# Patient Record
Sex: Female | Born: 2000
Health system: Southern US, Community
[De-identification: ages and names within clinical notes are randomized; demographics above are authoritative.]

---
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
## 2019-02-05 DIAGNOSIS — Z20828 Contact with and (suspected) exposure to other viral communicable diseases: Secondary | ICD-10-CM | POA: Diagnosis not present

## 2019-02-06 IMAGING — US US TRANSVAGINAL NON-OB
1 series · 14 of 25 positions shown · non-contrast
Comparison: None.

CLINICAL DATA: IUD location

EXAM:
ULTRASOUND PELVIS TRANSVAGINAL
TECHNIQUE: Transvaginal ultrasound examination of the pelvis was performed
including evaluation of the uterus, ovaries, adnexal regions, and
pelvic cul-de-sac.

[Series 1: us transvaginal non-ob · 0.08mm/px · 14 of 96 slices shown]
[im 1/96]
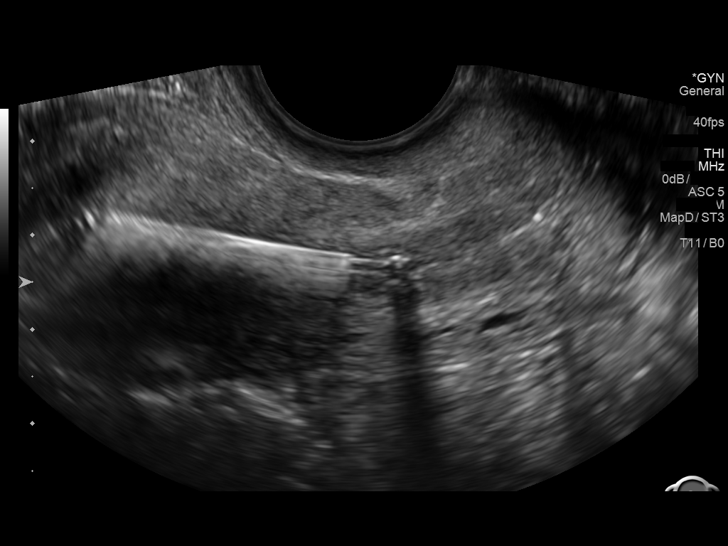
[im 8/96]
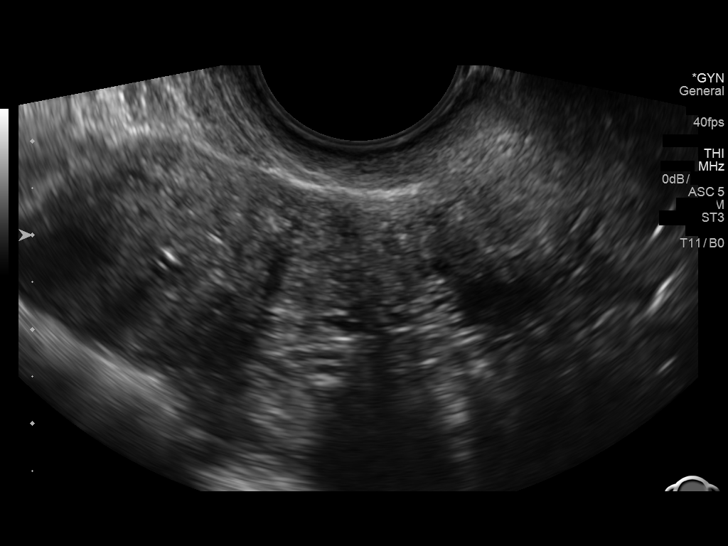
[im 16/96]
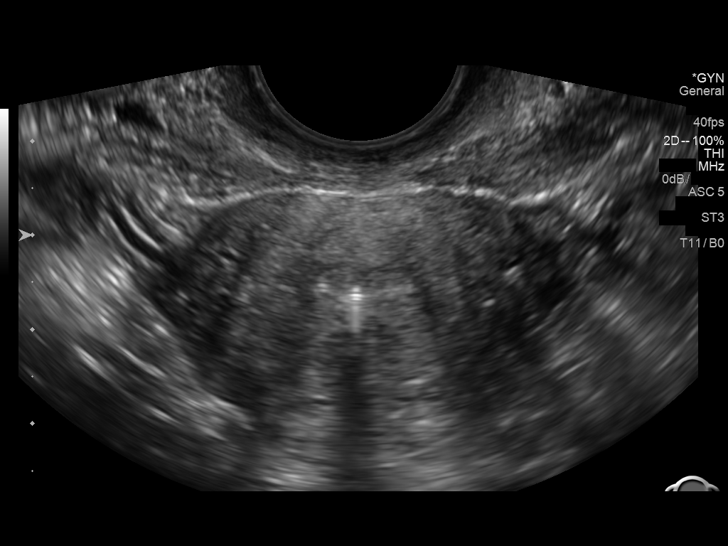
[im 24/96]
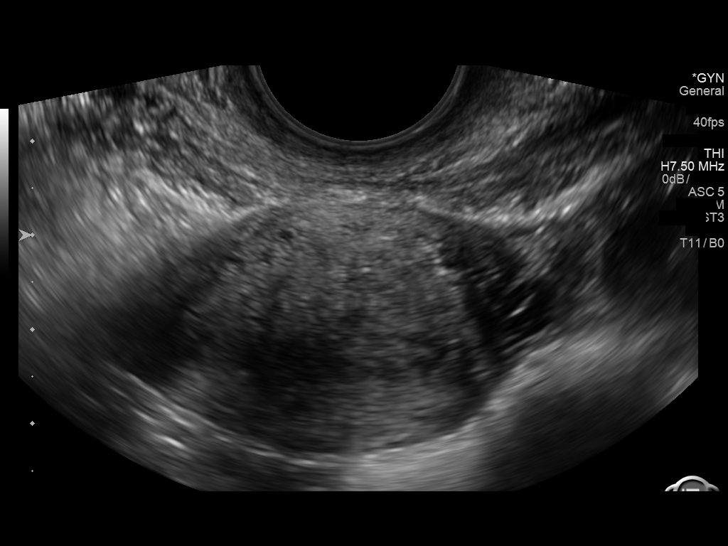
[im 32/96]
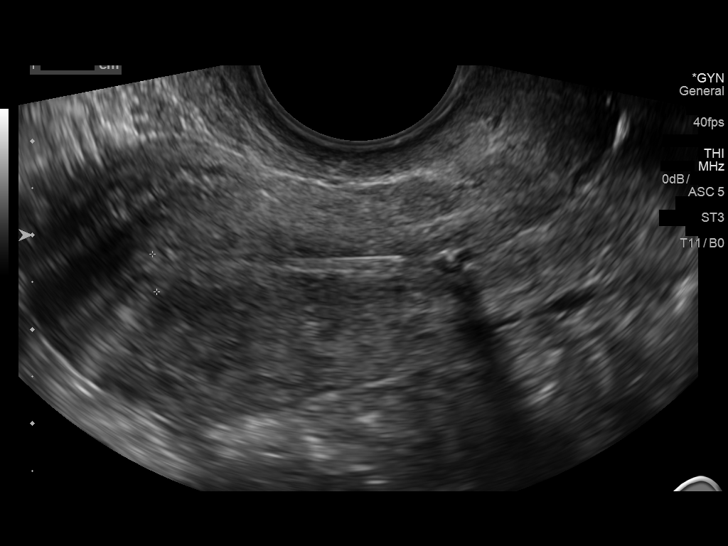
[im 36/96]
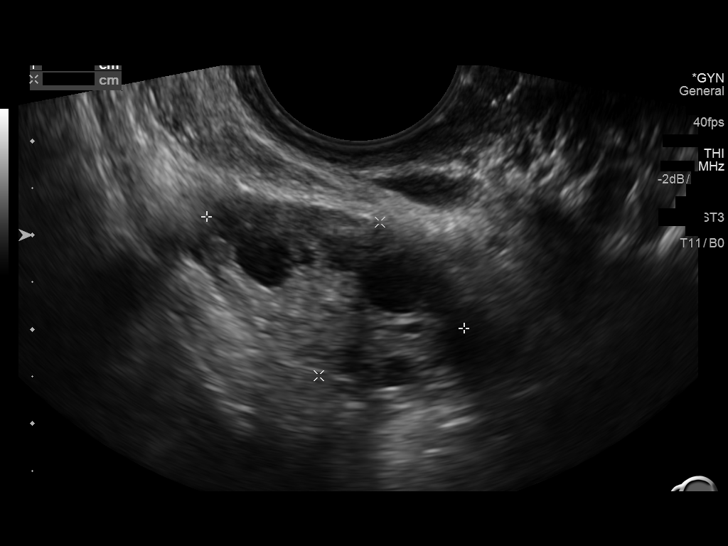
[im 44/96]
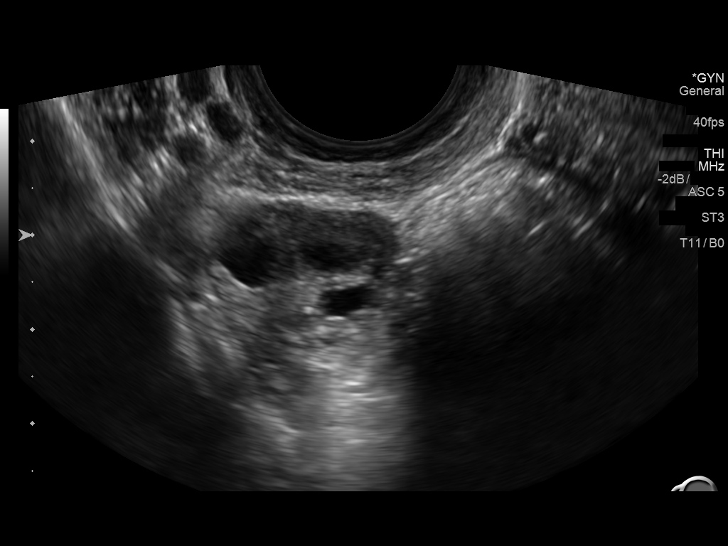
[im 52/96]
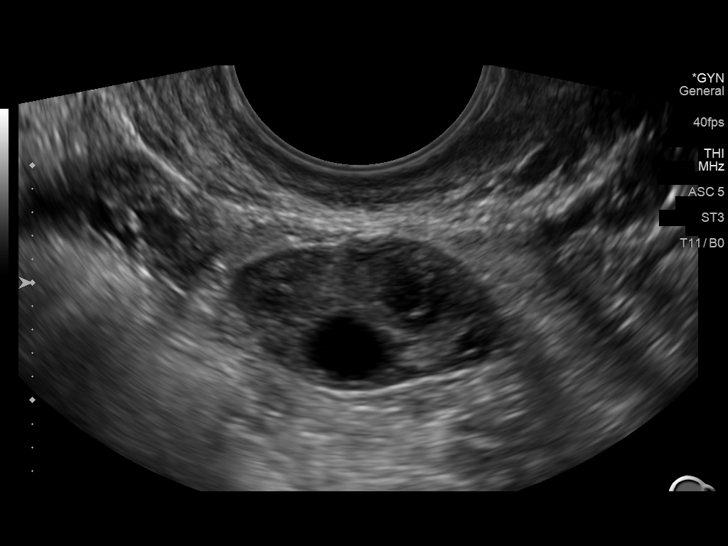
[im 60/96]
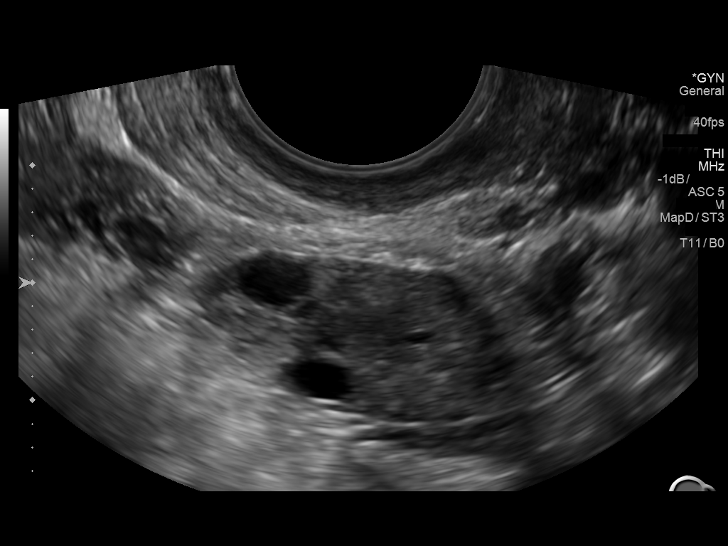
[im 64/96]
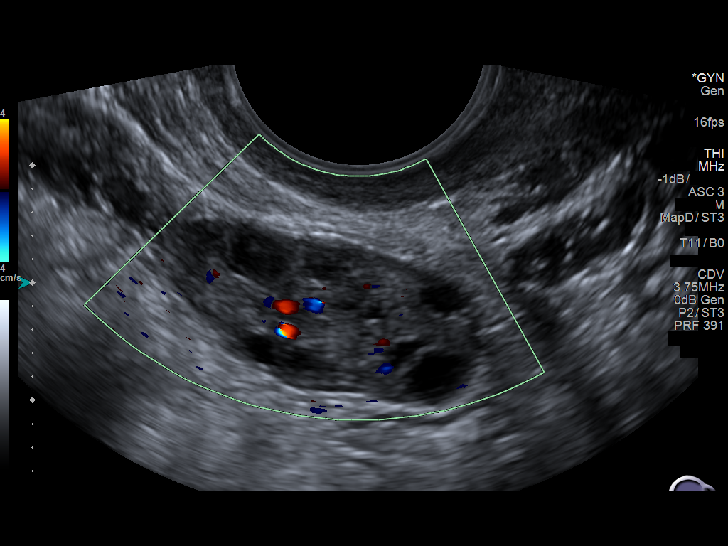
[im 72/96]
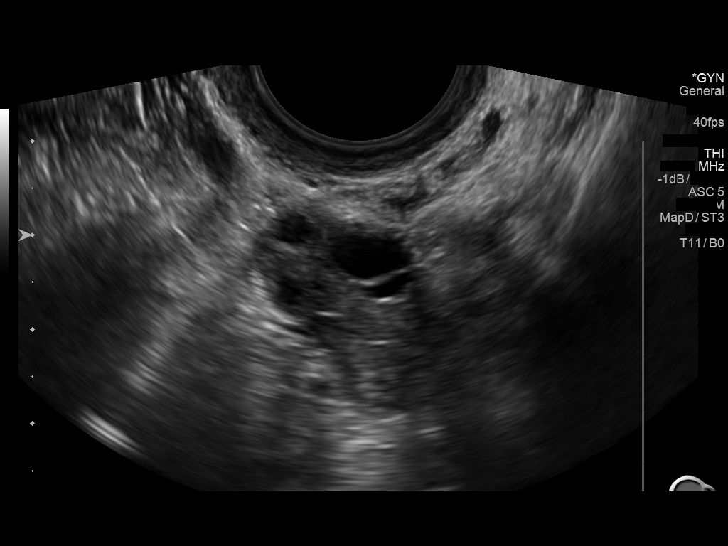
[im 80/96]
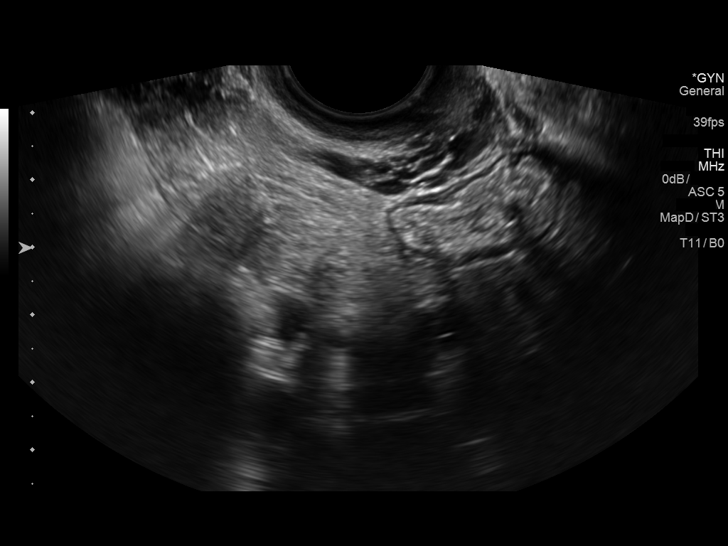
[im 88/96]
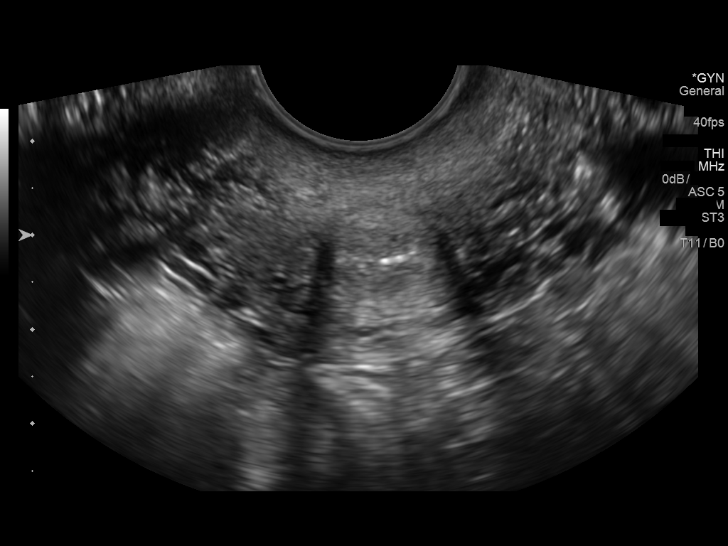
[im 96/96]
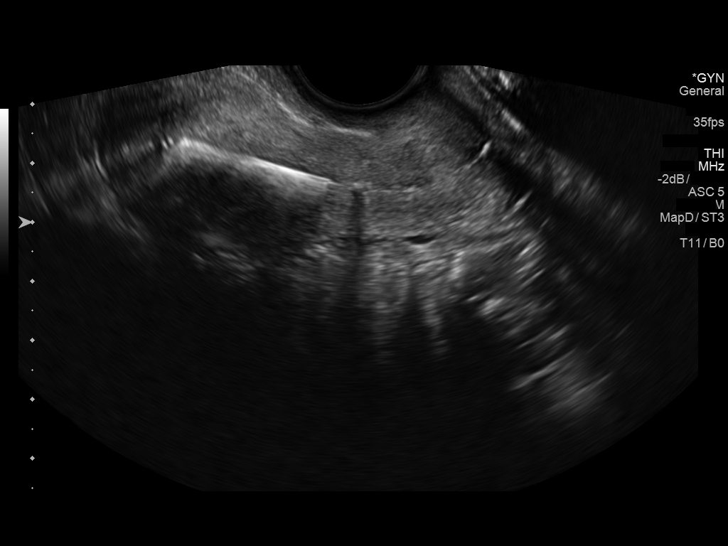

[14 of 25 positions shown; findings below may reference images not displayed]

FINDINGS: Uterus

Measurements: 6.9 x 3.0 x 3.8 cm. No fibroids or other mass
visualized.

Endometrium

Thickness: 4 mm in thickness. IUD noted within the endometrial canal
in expected position.

Right ovary

Measurements: 3.0 x 1.8 x 2.0 cm. Normal appearance/no adnexal mass.

Left ovary

Measurements: 2.5 x 1.4 x 1.7 cm. Normal appearance/no adnexal mass.

Other findings:  No abnormal free fluid
IMPRESSION: IUD noted in expected position within the endometrial canal.

Unremarkable pelvic ultrasound.

## 2019-02-10 DIAGNOSIS — Z20828 Contact with and (suspected) exposure to other viral communicable diseases: Secondary | ICD-10-CM | POA: Diagnosis not present

## 2019-03-28 ENCOUNTER — Telehealth: Payer: 59 | Admitting: Physician Assistant

## 2019-03-28 DIAGNOSIS — R21 Rash and other nonspecific skin eruption: Secondary | ICD-10-CM

## 2019-03-28 MED ORDER — DIPHENHYDRAMINE HCL 25 MG PO TABS: 25.0000 mg | ORAL_TABLET | Freq: Four times a day (QID) | ORAL | 0 refills | Status: AC | PRN

## 2019-03-28 MED ORDER — FAMOTIDINE 20 MG PO TABS: 20.0000 mg | ORAL_TABLET | Freq: Two times a day (BID) | ORAL | 0 refills | Status: AC | PRN

## 2019-03-28 MED ORDER — PREDNISONE 10 MG PO TABS: 50.0000 mg | ORAL_TABLET | Freq: Every day | ORAL | 0 refills | Status: AC

## 2019-03-28 NOTE — Progress Notes (Signed)
E Visit for Rash  We are sorry that you are not feeling well. Here is how we plan to help!  Based on what you shared with me it looks like you have contact dermatitis.  Contact dermatitis is a skin rash caused by something that touches the skin and causes irritation or inflammation.  Your skin may be red, swollen, dry, cracked, and itch.  The rash should go away in a few days but can last a few weeks.  If you get a rash, it's important to figure out what caused it so the irritant can be avoided in the future. I have prescribed Prednisone 50 mg daily for 5 days which should help with itching and inflammation. You can also take 25mg  benadryl every 6 hours and pepcid 20 mg twice daily as needed for itching.    HOME CARE:   Take cool showers and avoid direct sunlight.  Apply cool compress or wet dressings.  Take a bath in an oatmeal bath.  Sprinkle content of one Aveeno packet under running faucet with comfortably warm water.  Bathe for 15-20 minutes, 1-2 times daily.  Pat dry with a towel. Do not rub the rash.  Use hydrocortisone cream.  Take an antihistamine like Benadryl for widespread rashes that itch.  The adult dose of Benadryl is 25-50 mg by mouth 4 times daily.  Caution:  This type of medication may cause sleepiness.  Do not drink alcohol, drive, or operate dangerous machinery while taking antihistamines.  Do not take these medications if you have prostate enlargement.  Read package instructions thoroughly on all medications that you take.  GET HELP RIGHT AWAY IF:   Symptoms don't go away after treatment.  Severe itching that persists.  If you rash spreads or swells.  If you rash begins to smell.  If it blisters and opens or develops a yellow-brown crust.  You develop a fever.  You have a sore throat.  You become short of breath.  MAKE SURE YOU:  Understand these instructions. Will watch your condition. Will get help right away if you are not doing well or get  worse.  Thank you for choosing an e-visit. Your e-visit answers were reviewed by a board certified advanced clinical practitioner to complete your personal care plan. Depending upon the condition, your plan could have included both over the counter or prescription medications. Please review your pharmacy choice. Be sure that the pharmacy you have chosen is open so that you can pick up your prescription now.  If there is a problem you may message your provider in MyChart to have the prescription routed to another pharmacy. Your safety is important to . If you have drug allergies check your prescription carefully.  For the next 24 hours, you can use MyChart to ask questions about today's visit, request a non-urgent call back, or ask for a work or school excuse from your e-visit provider. You will get an email in the next two days asking about your experience. I hope that your e-visit has been valuable and will speed your recovery.  Greater than 5 minutes, yet less than 10 minutes of time have been spent researching, coordinating, and implementing care for this patient today.

## 2019-05-19 DIAGNOSIS — Z23 Encounter for immunization: Secondary | ICD-10-CM | POA: Diagnosis not present

## 2019-07-06 DIAGNOSIS — Z23 Encounter for immunization: Secondary | ICD-10-CM | POA: Diagnosis not present

## 2019-09-11 DIAGNOSIS — Z20822 Contact with and (suspected) exposure to covid-19: Secondary | ICD-10-CM | POA: Diagnosis not present

## 2020-03-29 DIAGNOSIS — F411 Generalized anxiety disorder: Secondary | ICD-10-CM | POA: Diagnosis not present

## 2020-04-05 DIAGNOSIS — F411 Generalized anxiety disorder: Secondary | ICD-10-CM | POA: Diagnosis not present

## 2020-05-18 DIAGNOSIS — Z01419 Encounter for gynecological examination (general) (routine) without abnormal findings: Secondary | ICD-10-CM | POA: Diagnosis not present
